# Patient Record
Sex: Female | Born: 1960 | Race: Black or African American | Hispanic: No | Marital: Married | Smoking: Former smoker
Health system: Southern US, Community
[De-identification: ages and names within clinical notes are randomized; demographics above are authoritative.]

## PROBLEM LIST (undated history)

## (undated) DIAGNOSIS — C50919 Malignant neoplasm of unspecified site of unspecified female breast: Secondary | ICD-10-CM

## (undated) DIAGNOSIS — Z5111 Encounter for antineoplastic chemotherapy: Secondary | ICD-10-CM

## (undated) DIAGNOSIS — E7800 Pure hypercholesterolemia, unspecified: Secondary | ICD-10-CM

## (undated) DIAGNOSIS — I10 Essential (primary) hypertension: Secondary | ICD-10-CM

---

## 2012-10-31 ENCOUNTER — Emergency Department (HOSPITAL_BASED_OUTPATIENT_CLINIC_OR_DEPARTMENT_OTHER)
Admission: EM | Admit: 2012-10-31 | Discharge: 2012-11-01 | Disposition: A | Discharge status: Home / Self Care | Payer: BC Managed Care – PPO | Attending: Emergency Medicine | Admitting: Emergency Medicine

## 2012-10-31 ENCOUNTER — Encounter (HOSPITAL_BASED_OUTPATIENT_CLINIC_OR_DEPARTMENT_OTHER): Payer: Self-pay

## 2012-10-31 DIAGNOSIS — N6310 Unspecified lump in the right breast, unspecified quadrant: Secondary | ICD-10-CM

## 2012-10-31 DIAGNOSIS — N63 Unspecified lump in unspecified breast: Secondary | ICD-10-CM | POA: Insufficient documentation

## 2012-10-31 DIAGNOSIS — R079 Chest pain, unspecified: Secondary | ICD-10-CM | POA: Insufficient documentation

## 2012-10-31 MED ORDER — CEPHALEXIN 500 MG PO CAPS: 500.0000 mg | ORAL_CAPSULE | Freq: Four times a day (QID) | ORAL | Status: DC

## 2012-10-31 NOTE — ED Provider Notes (Signed)
   History    CSN: [id number] Arrival date & time 10/31/12  2241  First MD Initiated Contact with Patient 10/31/12 2325     Chief Complaint  Patient presents with  . Breast Mass   (Consider location/radiation/quality/duration/timing/severity/associated sxs/prior Treatment) Patient is a 52 y.o. female presenting with chest pain. The history is provided by the patient. No language interpreter was used.  Chest Pain Pain location:  R chest Pain quality: aching   Pain radiates to:  Does not radiate Pain radiates to the back: no   Pain severity:  Moderate Onset quality:  Gradual Timing:  Constant Progression:  Worsening Chronicity:  New Relieved by:  Nothing Ineffective treatments:  None tried Pt complains of a knot in her right breast.   Pt reports area started draining this pm.   Pt reports  History reviewed. No pertinent past medical history. Past Surgical History  Procedure Laterality Date  . Cesarean section     No family history on file. History  Substance Use Topics  . Smoking status: Never Smoker   . Smokeless tobacco: Not on file  . Alcohol Use: No   OB History   Grav Para Term Preterm Abortions TAB SAB Ect Mult Living                 Review of Systems  Cardiovascular: Positive for chest pain.  All other systems reviewed and are negative.    Allergies  Review of patient's allergies indicates not on file.  Home Medications  No current outpatient prescriptions on file. BP 182/108  Pulse 84  Temp(Src) 98.7 F (37.1 C) (Oral)  Resp 20  Ht 5\' 4"  (1.626 m)  Wt 180 lb (81.647 kg)  BMI 30.88 kg/m2  SpO2 100% Physical Exam  Nursing note and vitals reviewed. Constitutional: She is oriented to person, place, and time. She appears well-developed and well-nourished.  HENT:  Head: Normocephalic and atraumatic.  Eyes: Pupils are equal, round, and reactive to light.  Cardiovascular: Normal rate.   Pulmonary/Chest: Effort normal.  Musculoskeletal: Normal range  of motion.  Neurological: She is alert and oriented to person, place, and time. She has normal reflexes.  Skin: Skin is warm.  Psychiatric: She has a normal mood and affect.    ED Course  Procedures (including critical care time) Labs Reviewed - No data to display No results found. 1. Breast mass, right     MDM  Pt advised to call [facility] to be seen for evaluation.   I advised pt I am concerned that this is a breast cancer,  I will treat with keflex to cover for infection due to drainage early today.  I explained to pt and husband my concern and the need for surgical evaluation.    Lonia Skinner [place], PA-C 11/01/12 0001

## 2012-10-31 NOTE — ED Notes (Signed)
C/o knot to right breast x 3-4 months-area started draining this pm

## 2012-11-01 NOTE — ED Provider Notes (Signed)
Medical screening examination/treatment/procedure(s) were performed by non-physician practitioner and as supervising physician I was immediately available for consultation/collaboration.   Dione Booze, MD 11/01/12 [phone]

## 2014-05-01 ENCOUNTER — Emergency Department (HOSPITAL_BASED_OUTPATIENT_CLINIC_OR_DEPARTMENT_OTHER)
Admission: EM | Admit: 2014-05-01 | Discharge: 2014-05-01 | Disposition: A | Discharge status: Home / Self Care | Payer: BC Managed Care – PPO | Attending: Emergency Medicine | Admitting: Emergency Medicine

## 2014-05-01 ENCOUNTER — Encounter (HOSPITAL_BASED_OUTPATIENT_CLINIC_OR_DEPARTMENT_OTHER): Payer: Self-pay

## 2014-05-01 DIAGNOSIS — Z853 Personal history of malignant neoplasm of breast: Secondary | ICD-10-CM | POA: Insufficient documentation

## 2014-05-01 DIAGNOSIS — H578 Other specified disorders of eye and adnexa: Secondary | ICD-10-CM | POA: Diagnosis present

## 2014-05-01 DIAGNOSIS — H109 Unspecified conjunctivitis: Secondary | ICD-10-CM | POA: Diagnosis not present

## 2014-05-01 DIAGNOSIS — Z792 Long term (current) use of antibiotics: Secondary | ICD-10-CM | POA: Diagnosis not present

## 2014-05-01 HISTORY — DX: Malignant neoplasm of unspecified site of unspecified female breast: C50.919

## 2014-05-01 HISTORY — DX: Encounter for antineoplastic chemotherapy: Z51.11

## 2014-05-01 MED ORDER — FLUORESCEIN SODIUM 1 MG OP STRP
ORAL_STRIP | OPHTHALMIC | Status: AC
  Filled 2014-05-01: qty 1

## 2014-05-01 MED ORDER — POLYMYXIN B-TRIMETHOPRIM 10000-0.1 UNIT/ML-% OP SOLN: 2.0000 [drp] | OPHTHALMIC | Status: DC

## 2014-05-01 MED ORDER — TETRACAINE HCL 0.5 % OP SOLN: 2.0000 [drp] | Freq: Once | OPHTHALMIC | Status: DC

## 2014-05-01 MED ORDER — FLUORESCEIN SODIUM 1 MG OP STRP: 1.0000 | ORAL_STRIP | Freq: Once | OPHTHALMIC | Status: DC

## 2014-05-01 MED ORDER — CROMOLYN SODIUM 4 % OP SOLN: 2.0000 [drp] | Freq: Two times a day (BID) | OPHTHALMIC | Status: AC

## 2014-05-01 MED ORDER — TETRACAINE HCL 0.5 % OP SOLN
OPHTHALMIC | Status: AC
  Filled 2014-05-01: qty 2

## 2014-05-01 NOTE — ED Notes (Signed)
Right eye red, draining and upper lid swelling- onset yesterday. Denies injury.

## 2014-05-01 NOTE — ED Notes (Signed)
MD at bedside. 

## 2014-05-01 NOTE — Discharge Instructions (Signed)
Conjunctivitis Conjunctivitis is commonly called "pink eye." Conjunctivitis can be caused by bacterial or viral infection, allergies, or injuries. There is usually redness of the lining of the eye, itching, discomfort, and sometimes discharge. There may be deposits of matter along the eyelids. A viral infection usually causes a watery discharge, while a bacterial infection causes a yellowish, thick discharge. Pink eye is very contagious and spreads by direct contact. You may be given antibiotic eyedrops as part of your treatment. Before using your eye medicine, remove all drainage from the eye by washing gently with warm water and cotton balls. Continue to use the medication until you have awakened 2 mornings in a row without discharge from the eye. Do not rub your eye. This increases the irritation and helps spread infection. Use separate towels from other household members. Wash your hands with soap and water before and after touching your eyes. Use cold compresses to reduce pain and sunglasses to relieve irritation from light. Do not wear contact lenses or wear eye makeup until the infection is gone. SEEK MEDICAL CARE IF:   Your symptoms are not better after 3 days of treatment.  You have increased pain or trouble seeing.  The outer eyelids become very red or swollen. Document Released: 05/28/2004 Document Revised: 07/13/2011 Document Reviewed: 04/20/2005 [facility] Patient Information 2015 [place], [place]. This information is not intended to replace advice given to you by your health care provider. Make sure you discuss any questions you have with your health care provider.  Eye Drops Use eye drops as directed. It may be easier to have someone help you put the drops in your eye. If you are alone, use the following instructions to help you.  Wash your hands before putting drops in your eyes.  Read the label and look at your medication. Check for any expiration date that may appear on the bottle or  tube. Changes of color may be a warning that the medication is old or ineffective. This is especially true if the medication has become brown in color. If you have questions or concerns, call your caregiver. DROPS  Tilt your head back with the affected eye uppermost. Gently pull down on your lower lid. Do not pull up on the upper lid.  Look up. Place the dropper or bottle just over the edge of the lower lid near the white portion at the bottom of the eye. The goal is to have the drop go into the little sac formed by the lower lid and the bottom of the eye itself. Do not release the drop from a height of several inches over the eye. That will only serve to startle the person receiving the medicine when it lands and forces a blink.  Steady your hand in a comfortable manner. An example would be to hold the dropper or bottle between your thumb and index (pointing) finger. Lean your index finger against the brow.  Then, slowly and gently squeeze one drop of medication into your eye.  Once the medication has been applied, place your finger between the lower eyelid and the nose, pressing firmly against the nose for 5-10 seconds. This will slow the process of the eye drop entering the small canal that normally drains tears into the nose, and therefore increases the exposure of the medicine to the eye for a few extra seconds. OINTMENTS  Look up. Place the tip of the tube just over the edge of the lower lid near the white portion at the bottom of the  eye. The goal is to create a line of ointment along the inner surface of the eyelid in the little sac formed by the lower lid and the bottom of the eye itself.  Avoid touching the tube tip to your eyeball or eyelid. This avoids contamination of the tube or the medicine in the tube.  Once a line of medicine has been created, hold the upper lid up and look down before releasing the upper lid. This will force the ointment to spread over the surface of the  eye.  Your vision will be very blurry for a few minutes after applying an ointment properly. This is normal and will clear as you continue to blink. For this reason, it is best to apply ointments just before going to sleep, or at a time when you can rest your eyes for 5-10 minutes after applying the medication. GENERAL  Store your medicine in a cool, dry place after each use.  If you need a second medication, wait at least two minutes. This helps the first medication to be taken up (absorbed) by the eye.  If you have been instructed to use both an eye drop and an eye ointment, always apply the drop first and then the ointment 3-4 minutes afterward. Never put medications into the eye unless the label reads, "For Ophthalmic Use," "For Use In Eyes" or "Eye Drops." If you have questions, call your caregiver. Document Released: 07/27/2000 Document Revised: 09/04/2013 Document Reviewed: 10/02/2008 [facility] Patient Information 2015 [place], [place]. This information is not intended to replace advice given to you by your health care provider. Make sure you discuss any questions you have with your health care provider.

## 2014-05-01 NOTE — ED Provider Notes (Signed)
CSN: [id number]     Arrival date & time 05/01/14  0813 History   First MD Initiated Contact with Patient 05/01/14 0818     Chief Complaint  Patient presents with  . Eye Problem     (Consider location/radiation/quality/duration/timing/severity/associated sxs/prior Treatment) Patient is a 53 y.o. female presenting with eye problem. The history is provided by the patient and the spouse. No language interpreter was used.  Eye Problem Location:  R eye Quality:  Tearing and foreign body sensation Severity:  Mild Onset quality:  Gradual Duration:  2 days Timing:  Constant Progression:  Worsening Chronicity:  New Context: not burn, not contact lens problem and not scratch   Context comment:  Wears fake eyelashes Relieved by:  Nothing Worsened by:  Nothing tried Ineffective treatments:  Commercial eye wash Associated symptoms: crusting, swelling and tearing   Associated symptoms: no decreased vision, no discharge, no double vision, no facial rash, no headaches, no inflammation, no nausea, no numbness, no photophobia, no scotomas, no vomiting and no weakness     Past Medical History  Diagnosis Date  . Breast cancer   . Maintenance chemotherapy following disease    Past Surgical History  Procedure Laterality Date  . Cesarean section     No family history on file. History  Substance Use Topics  . Smoking status: Never Smoker   . Smokeless tobacco: Not on file  . Alcohol Use: No   OB History    No data available     Review of Systems  Constitutional: Negative for fever, chills, diaphoresis, activity change, appetite change and fatigue.  HENT: Negative for congestion, facial swelling, rhinorrhea and sore throat.   Eyes: Negative for double vision, photophobia and discharge.  Respiratory: Negative for cough, chest tightness and shortness of breath.   Cardiovascular: Negative for chest pain, palpitations and leg swelling.  Gastrointestinal: Negative for nausea, vomiting,  abdominal pain and diarrhea.  Endocrine: Negative for polydipsia and polyuria.  Genitourinary: Negative for dysuria, frequency, difficulty urinating and pelvic pain.  Musculoskeletal: Negative for back pain, arthralgias, neck pain and neck stiffness.  Skin: Negative for color change and wound.  Allergic/Immunologic: Negative for immunocompromised state.  Neurological: Negative for facial asymmetry, weakness, numbness and headaches.  Hematological: Does not bruise/bleed easily.  Psychiatric/Behavioral: Negative for confusion and agitation.      Allergies  Review of patient's allergies indicates no known allergies.  Home Medications   Prior to Admission medications   Medication Sig Start Date End Date Taking? Authorizing Provider  cephALEXin (KEFLEX) 500 MG capsule Take 1 capsule (500 mg total) by mouth 4 (four) times daily. 10/31/12   Hollace Kinnier Sofia, PA-C   BP 134/76 mmHg  Pulse 72  Temp(Src) 98.4 F (36.9 C) (Oral)  Resp 16  SpO2 100% Physical Exam  Constitutional: She is oriented to person, place, and time. She appears well-developed and well-nourished. No distress.  HENT:  Head: Normocephalic and atraumatic.  Mouth/Throat: No oropharyngeal exudate.  Eyes: Pupils are equal, round, and reactive to light. Right eye exhibits discharge. Right eye exhibits no chemosis, no exudate and no hordeolum. No foreign body present in the right eye. Right conjunctiva is injected. Left conjunctiva is not injected. Left conjunctiva has no hemorrhage. No scleral icterus. Right eye exhibits normal extraocular motion and no nystagmus.  Fundoscopic exam:      The right eye shows no hemorrhage and no papilledema.  Slit lamp exam:      The right eye shows no corneal abrasion, no corneal  flare, no corneal ulcer and no fluorescein uptake.  Upper and lower R lids are edematous. Eyelash glue on upper lash line.   Neck: Normal range of motion. Neck supple.  Cardiovascular: Normal rate, regular rhythm and  normal heart sounds.  Exam reveals no gallop and no friction rub.   No murmur heard. Pulmonary/Chest: Effort normal and breath sounds normal. No respiratory distress. She has no wheezes. She has no rales.  Abdominal: Soft. Bowel sounds are normal. She exhibits no distension and no mass. There is no tenderness. There is no rebound and no guarding.  Musculoskeletal: Normal range of motion. She exhibits no edema or tenderness.  Neurological: She is alert and oriented to person, place, and time.  Skin: Skin is warm and dry.  Psychiatric: She has a normal mood and affect.    ED Course  Procedures (including critical care time) Labs Review Labs Reviewed - No data to display  Imaging Review No results found.   EKG Interpretation None      MDM   Final diagnoses:  Conjunctivitis of right eye    Pt is a 53 y.o. female with Pmhx as above who presents with conjunctival irritation, mild FB sensation and white drainage with matting from R eye since yesterday. She used eye wash w/o relief. She denies dec VA, photosensitivity, eye pain. No evidence of corneal abrasion with woods lamp exam, no inc IOP BL, slit lamp exam benign except for conjunctivitis, no cells or flare. She has on fake eyelashes for 2 weeks, has used in the past w/o difficulty. VA equal. Suspect a bacterial vs allergic conjunctivitis. Spoke w/ Dr. Frederico Hamman of othalmology given she has no f/u and it is the Holiday season w/ lmited office hours. He rec cromolyn gtt and can see her at 10am tomorrow.     Ernestina Patches, MD 05/01/14 [phone]

## 2018-12-14 ENCOUNTER — Other Ambulatory Visit: Payer: Self-pay

## 2018-12-14 ENCOUNTER — Encounter (HOSPITAL_BASED_OUTPATIENT_CLINIC_OR_DEPARTMENT_OTHER): Payer: Self-pay | Admitting: Emergency Medicine

## 2018-12-14 ENCOUNTER — Emergency Department (HOSPITAL_BASED_OUTPATIENT_CLINIC_OR_DEPARTMENT_OTHER)
Admission: EM | Admit: 2018-12-14 | Discharge: 2018-12-14 | Disposition: A | Discharge status: Home / Self Care | Payer: BC Managed Care – PPO | Attending: Emergency Medicine | Admitting: Emergency Medicine

## 2018-12-14 DIAGNOSIS — N390 Urinary tract infection, site not specified: Secondary | ICD-10-CM | POA: Diagnosis not present

## 2018-12-14 DIAGNOSIS — Z853 Personal history of malignant neoplasm of breast: Secondary | ICD-10-CM | POA: Diagnosis not present

## 2018-12-14 DIAGNOSIS — R300 Dysuria: Secondary | ICD-10-CM | POA: Diagnosis present

## 2018-12-14 DIAGNOSIS — Z79899 Other long term (current) drug therapy: Secondary | ICD-10-CM | POA: Insufficient documentation

## 2018-12-14 HISTORY — DX: Pure hypercholesterolemia, unspecified: E78.00

## 2018-12-14 HISTORY — DX: Essential (primary) hypertension: I10

## 2018-12-14 LAB — URINALYSIS, ROUTINE W REFLEX MICROSCOPIC
Bilirubin Urine: NEGATIVE
Glucose, UA: NEGATIVE mg/dL
Ketones, ur: NEGATIVE mg/dL
Nitrite: NEGATIVE
Protein, ur: 30 mg/dL — AB
Specific Gravity, Urine: 1.025 (ref 1.005–1.030)
pH: 6 (ref 5.0–8.0)

## 2018-12-14 LAB — URINALYSIS, MICROSCOPIC (REFLEX): WBC, UA: >50 WBC/hpf (ref 0–5)

## 2018-12-14 MED ORDER — CEPHALEXIN 500 MG PO CAPS: 500.0000 mg | ORAL_CAPSULE | Freq: Two times a day (BID) | ORAL | 0 refills | Status: AC

## 2018-12-14 MED FILL — CEPHALEXIN 500 MG CAPSULE: 500 | 5 days supply | Qty: 10 | Fill #0

## 2018-12-14 NOTE — ED Triage Notes (Signed)
Dysuria and lower back pain x 2 days, denies n/v or fever

## 2018-12-14 NOTE — ED Provider Notes (Addendum)
[facility] EMERGENCY DEPARTMENT Provider Note   CSN: [id number] Arrival date & time: 12/14/18  0800    History   Chief Complaint Chief Complaint  Patient presents with  . Dysuria    HPI Jo Miller is a 58 y.o. female.     The history is provided by the patient.  Dysuria Pain quality:  Burning Pain severity:  Mild Onset quality:  Gradual Duration:  2 days Timing:  Intermittent Progression:  Waxing and waning Chronicity:  New Recent urinary tract infections: no   Relieved by:  Nothing Ineffective treatments:  None tried Urinary symptoms: frequent urination   Associated symptoms: no abdominal pain, no fever, no flank pain, no genital lesions, no nausea, no vaginal discharge and no vomiting   Risk factors: no hx of pyelonephritis, no recurrent urinary tract infections and no sexually transmitted infections     Past Medical History:  Diagnosis Date  . Breast cancer ([place])   . High cholesterol   . Hypertension   . Maintenance chemotherapy following disease     There are no active problems to display for this patient.   Past Surgical History:  Procedure Laterality Date  . CESAREAN SECTION       OB History   No obstetric history on file.      Home Medications    Prior to Admission medications   Medication Sig Start Date End Date Taking? Authorizing Provider  amLODipine (NORVASC) 5 MG tablet Take by mouth. 03/02/16  Yes [provider]  atorvastatin (LIPITOR) 40 MG tablet atorvastatin 40 mg tablet    [provider]  cephALEXin (KEFLEX) 500 MG capsule Take 1 capsule (500 mg total) by mouth 2 (two) times daily for 5 days. 12/14/18 12/19/18  Tauheed Mcfayden, DO  cromolyn (OPTICROM) 4 % ophthalmic solution Place 2 drops into the right eye 2 (two) times daily. 05/01/14   Ernestina Patches, MD    Family History No family history on file.  Social History Social History   Tobacco Use  . Smoking status: Never Smoker  . Smokeless  tobacco: Never Used  Substance Use Topics  . Alcohol use: No  . Drug use: No     Allergies   Patient has no known allergies.   Review of Systems Review of Systems  Constitutional: Negative for chills and fever.  HENT: Negative for ear pain and sore throat.   Eyes: Negative for pain and visual disturbance.  Respiratory: Negative for cough and shortness of breath.   Cardiovascular: Negative for chest pain and palpitations.  Gastrointestinal: Negative for abdominal distention, abdominal pain, anal bleeding, nausea and vomiting.  Genitourinary: Negative for dysuria, flank pain, hematuria and vaginal discharge.  Musculoskeletal: Positive for back pain (not currently, feels like spasms in her low back when she urinates ). Negative for arthralgias.  Skin: Negative for color change and rash.  Neurological: Negative for seizures and syncope.  All other systems reviewed and are negative.    Physical Exam Updated Vital Signs  ED Triage Vitals  Enc Vitals Group     BP 12/14/18 0811 (!) 168/97     Pulse Rate 12/14/18 0811 65     Resp 12/14/18 0811 14     Temp 12/14/18 0811 97.9 F (36.6 C)     Temp Source 12/14/18 0811 Oral     SpO2 12/14/18 0811 100 %     Weight 12/14/18 0815 206 lb 1.6 oz (93.5 kg)     Height 12/14/18 0815 5\' 3"  (1.6 m)  Head Circumference --      Peak Flow --      Pain Score 12/14/18 0817 1     Pain Loc --      Pain Edu? --      Excl. in [place]? --      Physical Exam Vitals signs and nursing note reviewed.  Constitutional:      General: She is not in acute distress.    Appearance: She is well-developed.  HENT:     Head: Normocephalic and atraumatic.  Eyes:     Extraocular Movements: Extraocular movements intact.     Conjunctiva/sclera: Conjunctivae normal.     Pupils: Pupils are equal, round, and reactive to light.  Pulmonary:     Effort: Pulmonary effort is normal.  Abdominal:     General: There is no distension.     Palpations: Abdomen is soft.      Tenderness: There is no abdominal tenderness.  Musculoskeletal: Normal range of motion.        General: No tenderness.     Comments: No midline low back tenderness  Skin:    General: Skin is warm and dry.  Neurological:     Mental Status: She is alert.      ED Treatments / Results  Labs (all labs ordered are listed, but only abnormal results are displayed) Labs Reviewed  URINALYSIS, ROUTINE W REFLEX MICROSCOPIC - Abnormal; Notable for the following components:      Result Value   APPearance CLOUDY (*)    Hgb urine dipstick LARGE (*)    Protein, ur 30 (*)    Leukocytes,Ua LARGE (*)    All other components within normal limits  URINALYSIS, MICROSCOPIC (REFLEX) - Abnormal; Notable for the following components:   Bacteria, UA FEW (*)    All other components within normal limits  URINE CULTURE    EKG None  Radiology No results found.  Procedures Procedures (including critical care time)  Medications Ordered in ED Medications - No data to display   Initial Impression / Assessment and Plan / ED Course  I have reviewed the triage vital signs and the nursing notes.  Pertinent labs & imaging results that were available during my care of the patient were reviewed by me and considered in my medical decision making (see chart for details).     Jo Miller is a 58 year old female history of breast cancer in remission who presents to the ED with dysuria.  Patient with normal vitals.  No fever.  Symptoms ongoing for the last 2 days.  Has had some bladder spasm/low back pain when she urinates.  Overall she is comfortable now.  No abdominal tenderness on exam.  No back tenderness on exam.  Denies any trauma.  Doubt pyelonephritis.  She has not had any nausea or vomiting.  No concerns for other intra-abdominal process such as appendicitis or bowel obstruction.  Symptoms appear consistent with a UTI/possibly some mild dehydration.  Will evaluate with urinalysis.  Patient denies  any vaginal discharge, vaginal bleeding.  Pt w/ no concern for STDs.  UA positive for infection.  Will treat with Keflex.  Urine culture has been sent.  Patient discharged in ED in good condition.  This chart was dictated using voice recognition software.  Despite best efforts to proofread,  errors can occur which can change the documentation meaning.    Final Clinical Impressions(s) / ED Diagnoses   Final diagnoses:  Lower urinary tract infectious disease    ED Discharge Orders  Ordered    cephALEXin (KEFLEX) 500 MG capsule  2 times daily     12/14/18 0849           Lennice Sites, DO 12/14/18 0850    Lennice Sites, DO 12/14/18 [phone]

## 2018-12-14 NOTE — ED Notes (Signed)
ED Provider at bedside. 

## 2018-12-15 LAB — URINE CULTURE: Culture: <10000 — AB

## 2019-07-11 ENCOUNTER — Emergency Department (HOSPITAL_BASED_OUTPATIENT_CLINIC_OR_DEPARTMENT_OTHER)
Admission: EM | Admit: 2019-07-11 | Discharge: 2019-07-11 | Disposition: A | Discharge status: Home / Self Care | Payer: BC Managed Care – PPO | Attending: Emergency Medicine | Admitting: Emergency Medicine

## 2019-07-11 ENCOUNTER — Other Ambulatory Visit: Payer: Self-pay

## 2019-07-11 ENCOUNTER — Encounter (HOSPITAL_BASED_OUTPATIENT_CLINIC_OR_DEPARTMENT_OTHER): Payer: Self-pay | Admitting: *Deleted

## 2019-07-11 DIAGNOSIS — Z79899 Other long term (current) drug therapy: Secondary | ICD-10-CM | POA: Diagnosis not present

## 2019-07-11 DIAGNOSIS — I10 Essential (primary) hypertension: Secondary | ICD-10-CM | POA: Diagnosis not present

## 2019-07-11 DIAGNOSIS — M542 Cervicalgia: Secondary | ICD-10-CM | POA: Diagnosis not present

## 2019-07-11 DIAGNOSIS — S3992XA Unspecified injury of lower back, initial encounter: Secondary | ICD-10-CM | POA: Diagnosis present

## 2019-07-11 DIAGNOSIS — S39012A Strain of muscle, fascia and tendon of lower back, initial encounter: Secondary | ICD-10-CM | POA: Diagnosis not present

## 2019-07-11 DIAGNOSIS — Y9289 Other specified places as the place of occurrence of the external cause: Secondary | ICD-10-CM | POA: Insufficient documentation

## 2019-07-11 DIAGNOSIS — Y9389 Activity, other specified: Secondary | ICD-10-CM | POA: Insufficient documentation

## 2019-07-11 DIAGNOSIS — Y999 Unspecified external cause status: Secondary | ICD-10-CM | POA: Diagnosis not present

## 2019-07-11 MED ORDER — LIDOCAINE 5 % EX PTCH: 1.0000 | MEDICATED_PATCH | CUTANEOUS | 0 refills | Status: AC

## 2019-07-11 MED ORDER — METHOCARBAMOL 500 MG PO TABS: 500.0000 mg | ORAL_TABLET | Freq: Two times a day (BID) | ORAL | 0 refills | Status: AC

## 2019-07-11 NOTE — ED Notes (Signed)
Pt c/o neck soreness and lower left back pain that radiates towards left side.

## 2019-07-11 NOTE — Discharge Instructions (Addendum)
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
## 2021-04-03 ENCOUNTER — Emergency Department (HOSPITAL_BASED_OUTPATIENT_CLINIC_OR_DEPARTMENT_OTHER): Payer: BC Managed Care – PPO

## 2021-04-03 ENCOUNTER — Other Ambulatory Visit: Payer: Self-pay

## 2021-04-03 ENCOUNTER — Emergency Department (HOSPITAL_BASED_OUTPATIENT_CLINIC_OR_DEPARTMENT_OTHER)
Admission: EM | Admit: 2021-04-03 | Discharge: 2021-04-03 | Disposition: A | Discharge status: Home / Self Care | Payer: BC Managed Care – PPO | Attending: Emergency Medicine | Admitting: Emergency Medicine

## 2021-04-03 ENCOUNTER — Encounter (HOSPITAL_BASED_OUTPATIENT_CLINIC_OR_DEPARTMENT_OTHER): Payer: Self-pay

## 2021-04-03 DIAGNOSIS — W19XXXA Unspecified fall, initial encounter: Secondary | ICD-10-CM

## 2021-04-03 DIAGNOSIS — I10 Essential (primary) hypertension: Secondary | ICD-10-CM | POA: Insufficient documentation

## 2021-04-03 DIAGNOSIS — R0789 Other chest pain: Secondary | ICD-10-CM

## 2021-04-03 DIAGNOSIS — S299XXA Unspecified injury of thorax, initial encounter: Secondary | ICD-10-CM | POA: Insufficient documentation

## 2021-04-03 DIAGNOSIS — W108XXA Fall (on) (from) other stairs and steps, initial encounter: Secondary | ICD-10-CM | POA: Diagnosis not present

## 2021-04-03 DIAGNOSIS — Z87891 Personal history of nicotine dependence: Secondary | ICD-10-CM | POA: Diagnosis not present

## 2021-04-03 DIAGNOSIS — Z853 Personal history of malignant neoplasm of breast: Secondary | ICD-10-CM | POA: Insufficient documentation

## 2021-04-03 DIAGNOSIS — M25511 Pain in right shoulder: Secondary | ICD-10-CM

## 2021-04-03 DIAGNOSIS — Z79899 Other long term (current) drug therapy: Secondary | ICD-10-CM | POA: Insufficient documentation

## 2021-04-03 DIAGNOSIS — S4991XA Unspecified injury of right shoulder and upper arm, initial encounter: Secondary | ICD-10-CM | POA: Insufficient documentation

## 2021-04-03 MED ORDER — AMLODIPINE BESYLATE 5 MG PO TABS: 5.0000 mg | ORAL_TABLET | Freq: Every day | ORAL | 0 refills | Status: AC

## 2021-04-03 MED ORDER — HYDROCODONE-ACETAMINOPHEN 5-325 MG PO TABS
1.0000 | ORAL_TABLET | Freq: Once | ORAL | Status: AC
  Administered 2021-04-03: 1 via ORAL
  Filled 2021-04-03: qty 1

## 2021-04-03 MED ORDER — HYDROCODONE-ACETAMINOPHEN 5-325 MG PO TABS: 1.0000 | ORAL_TABLET | Freq: Four times a day (QID) | ORAL | 0 refills | Status: DC | PRN

## 2021-04-03 MED ORDER — LIDOCAINE 5 % EX PTCH
1.0000 | MEDICATED_PATCH | Freq: Once | CUTANEOUS | Status: DC
  Administered 2021-04-03: 1 via TRANSDERMAL
  Filled 2021-04-03: qty 1

## 2021-04-03 NOTE — ED Triage Notes (Signed)
Pt states she fell into patio chair 2 days ago-pain to right UE and across chest-NAD-steady gait

## 2021-04-03 NOTE — ED Notes (Signed)
Pt is in xray at this time 

## 2021-04-03 NOTE — ED Provider Notes (Signed)
[facility] EMERGENCY DEPARTMENT Provider Note   CSN: [id number] Arrival date & time: 04/03/21  1836     History Chief Complaint  Patient presents with   Lytle Michaels    Jo Miller is a 60 y.o. female.  Jo Miller is a 60 y.o. female with a history of hypertension, hyperlipidemia, breast cancer, who presents to the emergency department for evaluation after a fall.  Patient reports 2 days ago she tripped while going up the steps on her porch and ran into a patio chair.  She reports that she hit this with her right upper chest and shoulder and then fell back onto the back of her right shoulder.  She did not hit her head or have any loss of consciousness.  Reports pain is persistent in the right upper chest and under the right arm and worse with movement of the shoulder.  She denies any associated shortness of breath.  No abdominal pain.  No neck or back pain.  No pain in her lower extremities and she has been walking without difficulty.  No other aggravating or alleviating factors.  Patient noted to be hypertensive on arrival, reports that she was previously on blood pressure medication when she was on cancer treatment about 2 to 3 years ago but has not followed up with a doctor since then and has not been on any blood pressure medication.  She denies any associated headache, visual changes, dizziness, chest pain, shortness of breath or lower extremity swelling.  The history is provided by the patient and medical records.      Past Medical History:  Diagnosis Date   Breast cancer ([place])    High cholesterol    Hypertension    Maintenance chemotherapy following disease     There are no problems to display for this patient.   Past Surgical History:  Procedure Laterality Date   CESAREAN SECTION       OB History   No obstetric history on file.     No family history on file.  Social History   Tobacco Use   Smoking status: Former    Types: Cigarettes   Smokeless  tobacco: Never  Vaping Use   Vaping Use: Never used  Substance Use Topics   Alcohol use: No   Drug use: No    Home Medications Prior to Admission medications   Medication Sig Start Date End Date Taking? Authorizing Provider  HYDROcodone-acetaminophen (NORCO) 5-325 MG tablet Take 1 tablet by mouth every 6 (six) hours as needed. 04/03/21  Yes Jacqlyn Larsen, PA-C  amLODipine (NORVASC) 5 MG tablet Take 1 tablet (5 mg total) by mouth daily. 04/03/21   Jacqlyn Larsen, PA-C  atorvastatin (LIPITOR) 40 MG tablet atorvastatin 40 mg tablet    [provider]  cromolyn (OPTICROM) 4 % ophthalmic solution Place 2 drops into the right eye 2 (two) times daily. 05/01/14   Ernestina Patches, MD  lidocaine (LIDODERM) 5 % Place 1 patch onto the skin daily. Remove & Discard patch within 12 hours or as directed by MD 07/11/19   Joy, Shawn C, PA-C  methocarbamol (ROBAXIN) 500 MG tablet Take 1 tablet (500 mg total) by mouth 2 (two) times daily. 07/11/19   Joy, Helane Gunther, PA-C    Allergies    Patient has no known allergies.  Review of Systems   Review of Systems  Constitutional:  Negative for chills and fever.  HENT: Negative.    Eyes:  Negative for visual disturbance.  Respiratory:  Negative for shortness of breath.   Cardiovascular:  Positive for chest pain.  Gastrointestinal:  Negative for abdominal pain, nausea and vomiting.  Musculoskeletal:  Positive for arthralgias. Negative for back pain and neck pain.  Skin:  Negative for color change and wound.  Neurological:  Negative for dizziness, weakness, light-headedness, numbness and headaches.  All other systems reviewed and are negative.  Physical Exam Updated Vital Signs BP (!) 177/112   Pulse 62   Temp 98 F (36.7 C) (Oral)   Resp 16   Ht 5\' 4"  (1.626 m)   Wt 82.1 kg   SpO2 97%   BMI 31.07 kg/m   Physical Exam Vitals and nursing note reviewed.  Constitutional:      General: She is not in acute distress.    Appearance: Normal  appearance. She is well-developed and normal weight. She is not ill-appearing or diaphoretic.  HENT:     Head: Normocephalic and atraumatic.     Comments: No evidence of head trauma Eyes:     General:        Right eye: No discharge.        Left eye: No discharge.  Cardiovascular:     Rate and Rhythm: Normal rate and regular rhythm.     Pulses: Normal pulses.     Heart sounds: Normal heart sounds.  Pulmonary:     Effort: Pulmonary effort is normal. No respiratory distress.     Breath sounds: Normal breath sounds. No wheezing or rales.     Comments: Respirations equal and unlabored, patient able to speak in full sentences, lungs clear to auscultation bilaterally, tenderness over the right upper chest wall and right lateral ribs, no ecchymosis, no deformity or crepitus Chest:     Chest wall: Tenderness present.  Abdominal:     General: Bowel sounds are normal. There is no distension.     Palpations: Abdomen is soft. There is no mass.     Tenderness: There is no abdominal tenderness. There is no guarding.     Comments: Abdomen soft, nondistended, nontender to palpation in all quadrants without guarding or peritoneal signs  Musculoskeletal:        General: Tenderness present. No deformity.     Cervical back: Neck supple.     Comments: Tenderness over the right shoulder without deformity, ROM intact with mild pain, all other joints supple and easily moveable, no midline spinal tenderness  Skin:    General: Skin is warm and dry.     Capillary Refill: Capillary refill takes less than 2 seconds.  Neurological:     Mental Status: She is alert and oriented to person, place, and time.     Coordination: Coordination normal.     Comments: Speech is clear, able to follow commands Moves extremities without ataxia, coordination intact  Psychiatric:        Mood and Affect: Mood normal.        Behavior: Behavior normal.    ED Results / Procedures / Treatments   Labs (all labs ordered are  listed, but only abnormal results are displayed) Labs Reviewed - No data to display  EKG None  Radiology DG Ribs Unilateral W/Chest Right  Result Date: 04/03/2021 CLINICAL DATA:  Status post fall. EXAM: RIGHT RIBS AND CHEST - 3+ VIEW COMPARISON:  None. FINDINGS: No fracture or other bone lesions are seen involving the ribs. A left-sided venous Port-A-Cath is in place, with its distal tip seen overlying the midportion of the superior vena cava. There  is no evidence of pneumothorax or pleural effusion. Very mild atelectasis is seen within the bilateral lung bases. Heart size and mediastinal contours are within normal limits. IMPRESSION: No acute rib fracture or other bone lesions are seen. Electronically Signed   By: Virgina Norfolk M.D.   On: 04/03/2021 19:43   DG Shoulder Right  Result Date: 04/03/2021 CLINICAL DATA:  Status post fall. EXAM: RIGHT SHOULDER - 2+ VIEW COMPARISON:  None. FINDINGS: There is no evidence of an acute fracture or dislocation. There is no evidence of arthropathy or other focal bone abnormality. A left-sided venous catheter is seen. Soft tissues are unremarkable. IMPRESSION: Negative. Electronically Signed   By: Virgina Norfolk M.D.   On: 04/03/2021 19:42    Procedures Procedures   Medications Ordered in ED Medications  lidocaine (LIDODERM) 5 % 1 patch (1 patch Transdermal Patch Applied 04/03/21 2019)  HYDROcodone-acetaminophen (NORCO/VICODIN) 5-325 MG per tablet 1 tablet (1 tablet Oral Given 04/03/21 1939)    ED Course  I have reviewed the triage vital signs and the nursing notes.  Pertinent labs & imaging results that were available during my care of the patient were reviewed by me and considered in my medical decision making (see chart for details).    MDM Rules/Calculators/A&P                           60 year old female presents after a fall 2 days ago.  She tripped going up to her porch and hit her right shoulder and right upper chest on a patio  chair.  Pain has continued over the past 2 days.  Pain is worse with movement.  No significant deformity on exam in the right upper extremity is neurovascularly intact.  Breath sounds are present and equal bilaterally.  Concern for soft tissue injury versus rib fracture.  We will get x-rays of the right shoulder and right chest and ribs.  Pain treated here in the ED.  X-rays reviewed by myself, agree with radiologist findings, no acute bony abnormalities to the right shoulder, no dislocation, no acute rib fracture or other abnormalities noted on the chest and rib films.  We will treat for chest wall pain versus occult rib fracture, patient provided with incentive spirometer and will prescribe short course of hydrocodone, will also encourage patient to use topical lidocaine patches, ice and heat.  PCP follow-up encouraged.  Pt's blood pressure was elevated today, pt has hx of HTN, has not been on blood pressure medications in 2 to 3 years, pt is not exhibiting any symptoms to suggest hypertensive urgency or emergency today, will have pt follow up with their PCP in 1 week for blood pressure check. Discussed long term consequences of untreated hypertension with the patient.   Final Clinical Impression(s) / ED Diagnoses Final diagnoses:  Fall, initial encounter  Chest wall pain  Acute pain of right shoulder    Rx / DC Orders ED Discharge Orders          Ordered    amLODipine (NORVASC) 5 MG tablet  Daily        04/03/21 2027    HYDROcodone-acetaminophen (NORCO) 5-325 MG tablet  Every 6 hours PRN        04/03/21 2027             Jacqlyn Larsen, PA-C 04/03/21 2035    Fredia Sorrow, MD 04/05/21 [phone]

## 2021-04-03 NOTE — Discharge Instructions (Addendum)
Your x-rays today look good.  You could still have a small rib fracture that is not seen on chest x-ray.  This is treated with pain management and using incentive spirometer to make sure you are taking good deep breaths.  Use this several times throughout the day.  Use the pain medication as prescribed, take this medication with caution as it can cause drowsiness, do not drive while on this medication.  Your blood pressure was elevated today, please follow up with your primary doctor in 1 week for blood pressure recheck.

## 2022-12-12 ENCOUNTER — Encounter (HOSPITAL_BASED_OUTPATIENT_CLINIC_OR_DEPARTMENT_OTHER): Payer: Self-pay | Admitting: Emergency Medicine

## 2022-12-12 ENCOUNTER — Other Ambulatory Visit: Payer: Self-pay

## 2022-12-12 ENCOUNTER — Emergency Department (HOSPITAL_BASED_OUTPATIENT_CLINIC_OR_DEPARTMENT_OTHER)
Admission: EM | Admit: 2022-12-12 | Discharge: 2022-12-12 | Disposition: A | Discharge status: Home / Self Care | Payer: BC Managed Care – PPO | Attending: Emergency Medicine | Admitting: Emergency Medicine

## 2022-12-12 DIAGNOSIS — R000 Tachycardia, unspecified: Secondary | ICD-10-CM | POA: Insufficient documentation

## 2022-12-12 DIAGNOSIS — N3000 Acute cystitis without hematuria: Secondary | ICD-10-CM | POA: Diagnosis not present

## 2022-12-12 DIAGNOSIS — Z79899 Other long term (current) drug therapy: Secondary | ICD-10-CM | POA: Diagnosis not present

## 2022-12-12 DIAGNOSIS — U071 COVID-19: Secondary | ICD-10-CM | POA: Diagnosis not present

## 2022-12-12 DIAGNOSIS — Z853 Personal history of malignant neoplasm of breast: Secondary | ICD-10-CM | POA: Diagnosis not present

## 2022-12-12 DIAGNOSIS — I10 Essential (primary) hypertension: Secondary | ICD-10-CM | POA: Diagnosis not present

## 2022-12-12 DIAGNOSIS — R059 Cough, unspecified: Secondary | ICD-10-CM | POA: Diagnosis present

## 2022-12-12 LAB — URINALYSIS, ROUTINE W REFLEX MICROSCOPIC
Glucose, UA: 100 mg/dL — AB
Ketones, ur: NEGATIVE mg/dL
Nitrite: POSITIVE — AB
Protein, ur: 100 mg/dL — AB
Specific Gravity, Urine: 1.025 (ref 1.005–1.030)
pH: 5.5 (ref 5.0–8.0)

## 2022-12-12 LAB — URINALYSIS, MICROSCOPIC (REFLEX): WBC, UA: >50 WBC/hpf (ref 0–5)

## 2022-12-12 LAB — RESP PANEL BY RT-PCR (RSV, FLU A&B, COVID)  RVPGX2
Influenza A by PCR: NEGATIVE
Influenza B by PCR: NEGATIVE
Resp Syncytial Virus by PCR: NEGATIVE
SARS Coronavirus 2 by RT PCR: POSITIVE — AB

## 2022-12-12 MED ORDER — CEPHALEXIN 500 MG PO CAPS: 500.0000 mg | ORAL_CAPSULE | Freq: Two times a day (BID) | ORAL | 0 refills | Status: AC

## 2022-12-12 NOTE — ED Triage Notes (Signed)
Dysuria x 4 days , take AZO with no relief , no abd pain yet lower back pain . Denies hematuria .   Also reports congestion x 2 days and headache , cough up yellow sputum  . Scratchy throat yet no sore throat .

## 2022-12-12 NOTE — ED Provider Notes (Signed)
[facility] DEPARTMENT AT [facility] Provider Note   CSN: [id number] Arrival date & time: 12/12/22  1425     History Chief Complaint  Patient presents with   Dysuria   URI    Jo Miller is a 62 y.o. female.  Patient with past history significant for breast cancer in remission, hypertension, high cholesterol presents to the emergency department concerns of dysuria.  Reports that the dysuria is been ongoing for the last 4 days and denies any abdominal pain, low back pain, hematuria, or fevers.  Patient is also reporting 2 days of headache, congestion, cough, and a scratchy throat.  No obvious exposures to anyone else with similar symptoms.  Has tried taking over-the-counter medications for pain as well as Azo for urinary symptoms without improvement.   Dysuria URI Presenting symptoms: cough        Home Medications Prior to Admission medications   Medication Sig Start Date End Date Taking? Authorizing Provider  cephALEXin (KEFLEX) 500 MG capsule Take 1 capsule (500 mg total) by mouth 2 (two) times daily for 7 days. 12/12/22 12/19/22 Yes Smitty Knudsen, PA-C  amLODipine (NORVASC) 5 MG tablet Take 1 tablet (5 mg total) by mouth daily. 04/03/21   Dartha Lodge, PA-C  atorvastatin (LIPITOR) 40 MG tablet atorvastatin 40 mg tablet    [provider]  cromolyn (OPTICROM) 4 % ophthalmic solution Place 2 drops into the right eye 2 (two) times daily. 05/01/14   Toy Cookey, MD  HYDROcodone-acetaminophen (NORCO) 5-325 MG tablet Take 1 tablet by mouth every 6 (six) hours as needed. 04/03/21   Dartha Lodge, PA-C  lidocaine (LIDODERM) 5 % Place 1 patch onto the skin daily. Remove & Discard patch within 12 hours or as directed by MD 07/11/19   Joy, Shawn C, PA-C  methocarbamol (ROBAXIN) 500 MG tablet Take 1 tablet (500 mg total) by mouth 2 (two) times daily. 07/11/19   Joy, Hillard Danker, PA-C      Allergies    Patient has no known allergies.    Review of Systems    Review of Systems  Respiratory:  Positive for cough.   Genitourinary:  Positive for dysuria.  All other systems reviewed and are negative.   Physical Exam Updated Vital Signs BP (!) 163/111 (BP Location: Left Arm)   Pulse (!) 106   Temp 98.1 F (36.7 C) (Oral)   Resp 16   Wt 85.4 kg   SpO2 97%   BMI 32.32 kg/m  Physical Exam Vitals and nursing note reviewed.  Constitutional:      General: She is not in acute distress.    Appearance: Normal appearance. She is well-developed. She is not ill-appearing.  HENT:     Head: Normocephalic and atraumatic.  Eyes:     Conjunctiva/sclera: Conjunctivae normal.  Cardiovascular:     Rate and Rhythm: Regular rhythm. Tachycardia present.     Heart sounds: No murmur heard. Pulmonary:     Effort: Pulmonary effort is normal. No respiratory distress.     Breath sounds: Normal breath sounds. No wheezing or rales.  Abdominal:     Palpations: Abdomen is soft.     Tenderness: There is no abdominal tenderness.  Musculoskeletal:        General: No swelling.     Cervical back: Neck supple.  Skin:    General: Skin is warm and dry.     Capillary Refill: Capillary refill takes less than 2 seconds.  Neurological:  Mental Status: She is alert.  Psychiatric:        Mood and Affect: Mood normal.     ED Results / Procedures / Treatments   Labs (all labs ordered are listed, but only abnormal results are displayed) Labs Reviewed  RESP PANEL BY RT-PCR (RSV, FLU A&B, COVID)  RVPGX2 - Abnormal; Notable for the following components:      Result Value   SARS Coronavirus 2 by RT PCR POSITIVE (*)    All other components within normal limits  URINALYSIS, ROUTINE W REFLEX MICROSCOPIC - Abnormal; Notable for the following components:   Color, Urine AMBER (*)    APPearance HAZY (*)    Glucose, UA 100 (*)    Hgb urine dipstick TRACE (*)    Bilirubin Urine SMALL (*)    Protein, ur 100 (*)    Nitrite POSITIVE (*)    Leukocytes,Ua SMALL (*)    All  other components within normal limits  URINALYSIS, MICROSCOPIC (REFLEX) - Abnormal; Notable for the following components:   Bacteria, UA MANY (*)    All other components within normal limits    EKG None  Radiology No results found.  Procedures Procedures   Medications Ordered in ED Medications - No data to display  ED Course/ Medical Decision Making/ A&P                               Medical Decision Making Amount and/or Complexity of Data Reviewed Labs: ordered.   This patient presents to the ED for concern of dysuria, URI.  Differential diagnosis includes UTI, pyelonephritis, nephrolithiasis, COVID-19, influenza   Lab Tests:  I Ordered, and personally interpreted labs.  The pertinent results include: UA with evidence of infection, COVID-19 positive   Problem List / ED Course:  Patient presented to the emergency department complaints of dysuria and cough.  No obvious sick exposures.  States that the dysuria has been present for the last 4 days or so and has tried taking Azo without improvement in symptoms.  With frequent history of UTIs per patient's reported history.  Has not currently been on any antibiotics but currently bout of symptoms.  Will evaluate with UA and respiratory viral panel. Patient does have evidence of UTI on UA.  Will require treatment with antibiotics for improvement.  Patient is also positive for COVID-19 at this time which is likely explaining her symptoms of cough, congestion, and subjective fevers.  Given low risk factors and discussion with patient, patient prefers to manage symptoms with over-the-counter medications.  Advised patient to return the emergency department if she has any acute worsening of symptoms or declining such as severe shortness of breath, severe fever, chest pain, or intractable vomiting.  Low concern at this time for any worsening or more concerning symptoms that would require admission.  Patient is agreeable with plan to  discharge home and verbalized understanding all return precautions.  All questions answered prior to patient discharge.  Final Clinical Impression(s) / ED Diagnoses Final diagnoses:  Acute cystitis without hematuria  COVID-19    Rx / DC Orders ED Discharge Orders          Ordered    cephALEXin (KEFLEX) 500 MG capsule  2 times daily        12/12/22 1720              Smitty Knudsen, PA-C 12/12/22 1725    Charlynne Pander, MD 12/13/22  1456  

## 2022-12-12 NOTE — Discharge Instructions (Addendum)
You are seen in the emergency department for concerns of urinary pain as well as respiratory viral symptoms.  Your urinalysis does show evidence of infection and AN antibiotic has been sent to your pharmacy which you should take twice daily for the next 7 days.  Your respiratory viral panel is also positive for COVID-19.  Manage her symptoms with over-the-counter medications at home as best you can.  Try to limit your exposure to other individuals who may be vulnerable.  If symptoms are acutely worsening, please return to the emergency department.  Otherwise follow-up with your primary care provider.

## 2023-03-03 IMAGING — DX DG SHOULDER 2+V*R*
3 series · 3 of 3 positions shown · non-contrast
Comparison: None.

CLINICAL DATA: Status post fall.

EXAM:
RIGHT SHOULDER - 2+ VIEW

[shoulder grashey]
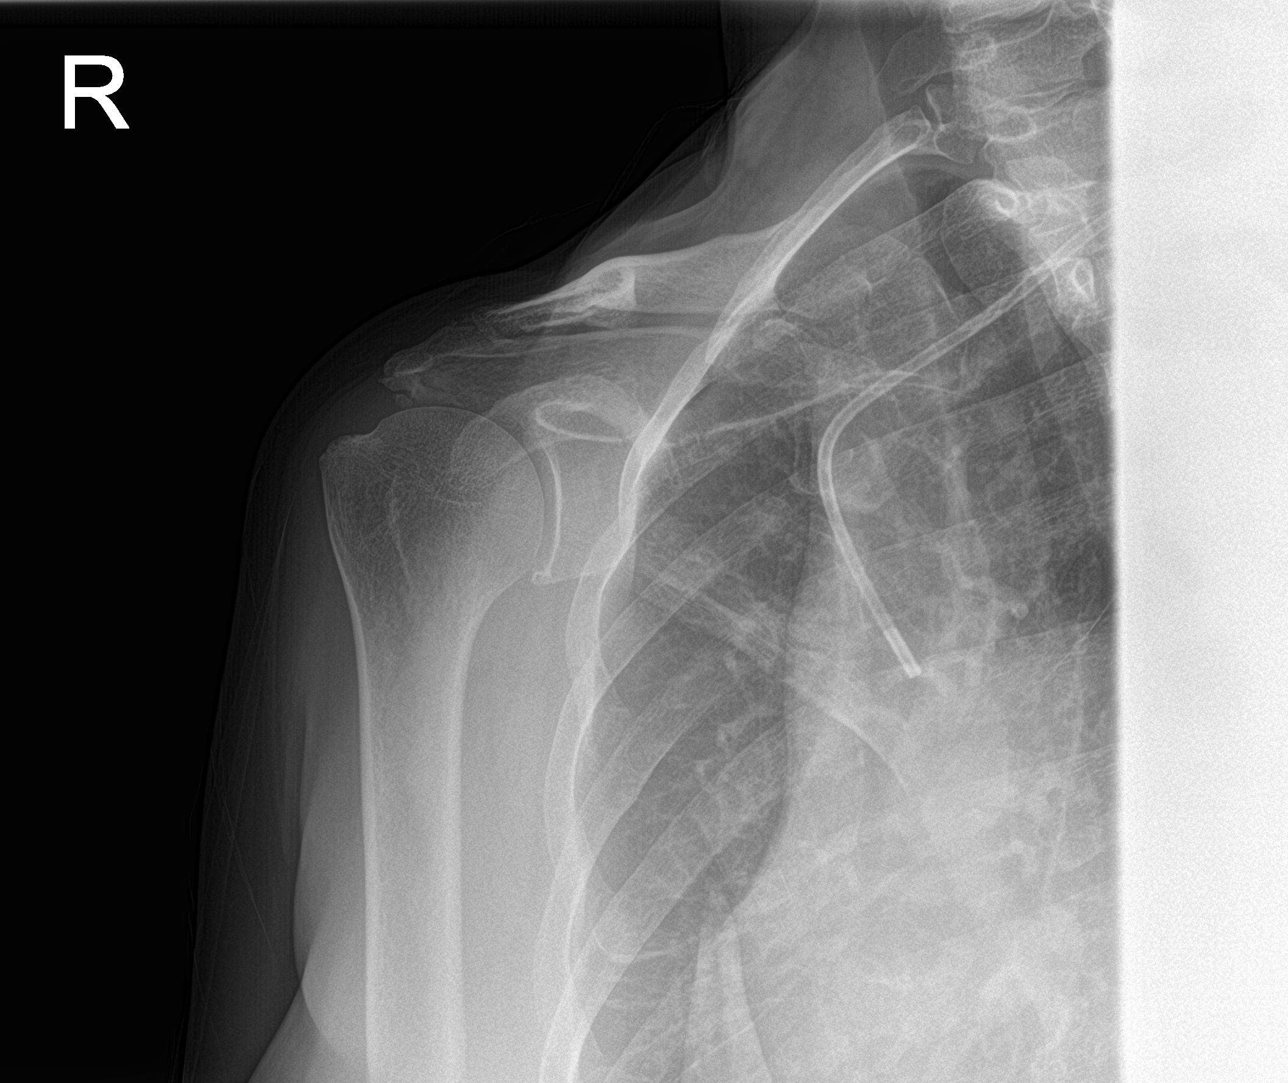

[shoulder y view]
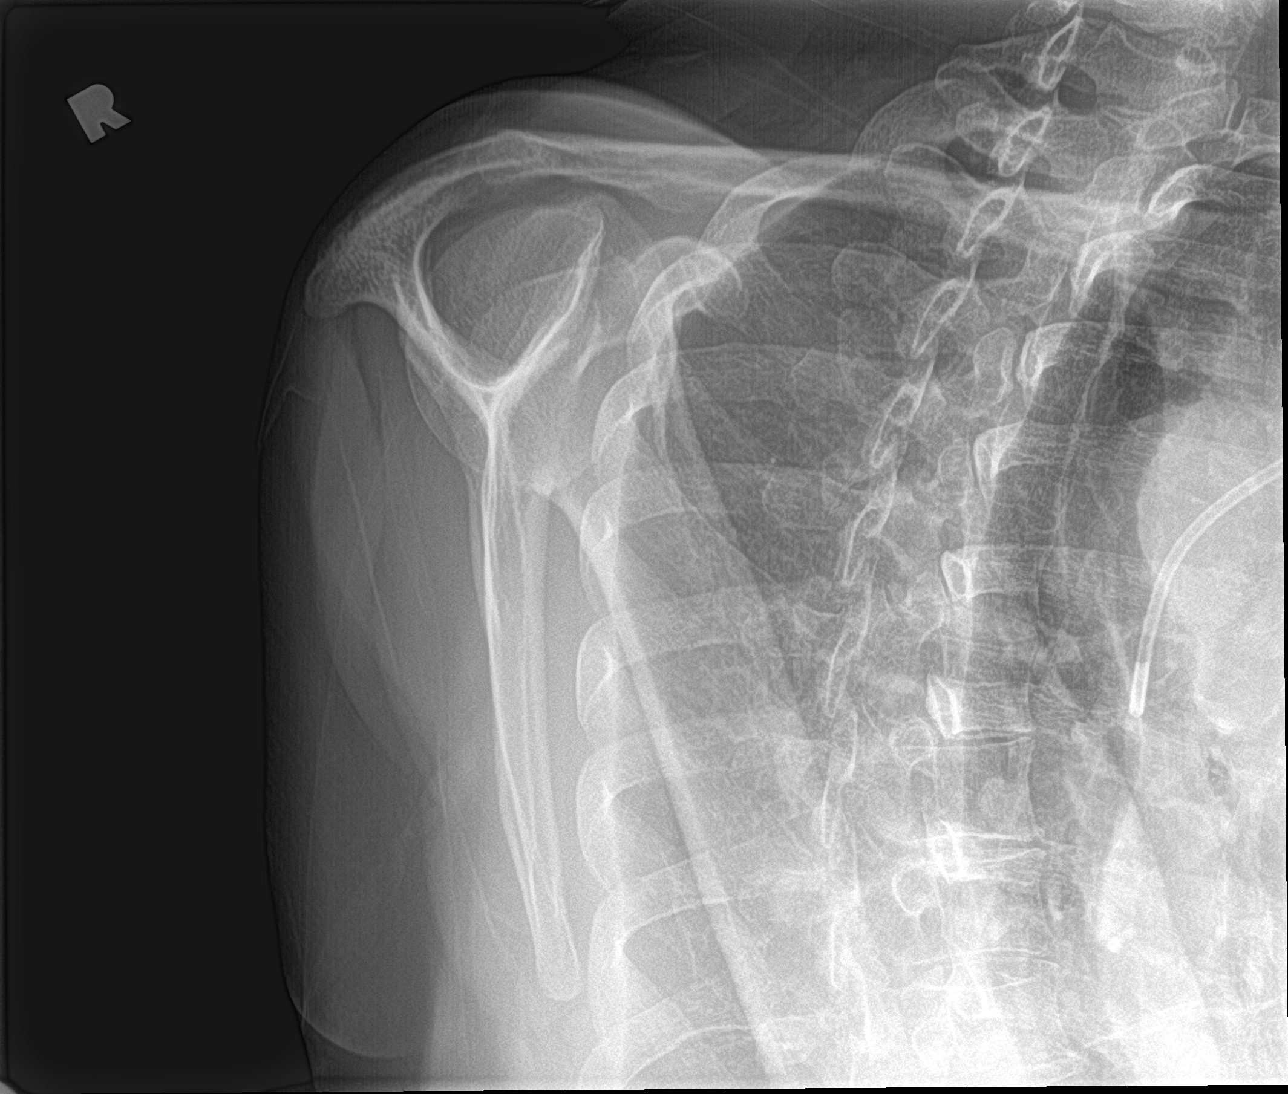

[shoulder axillary]
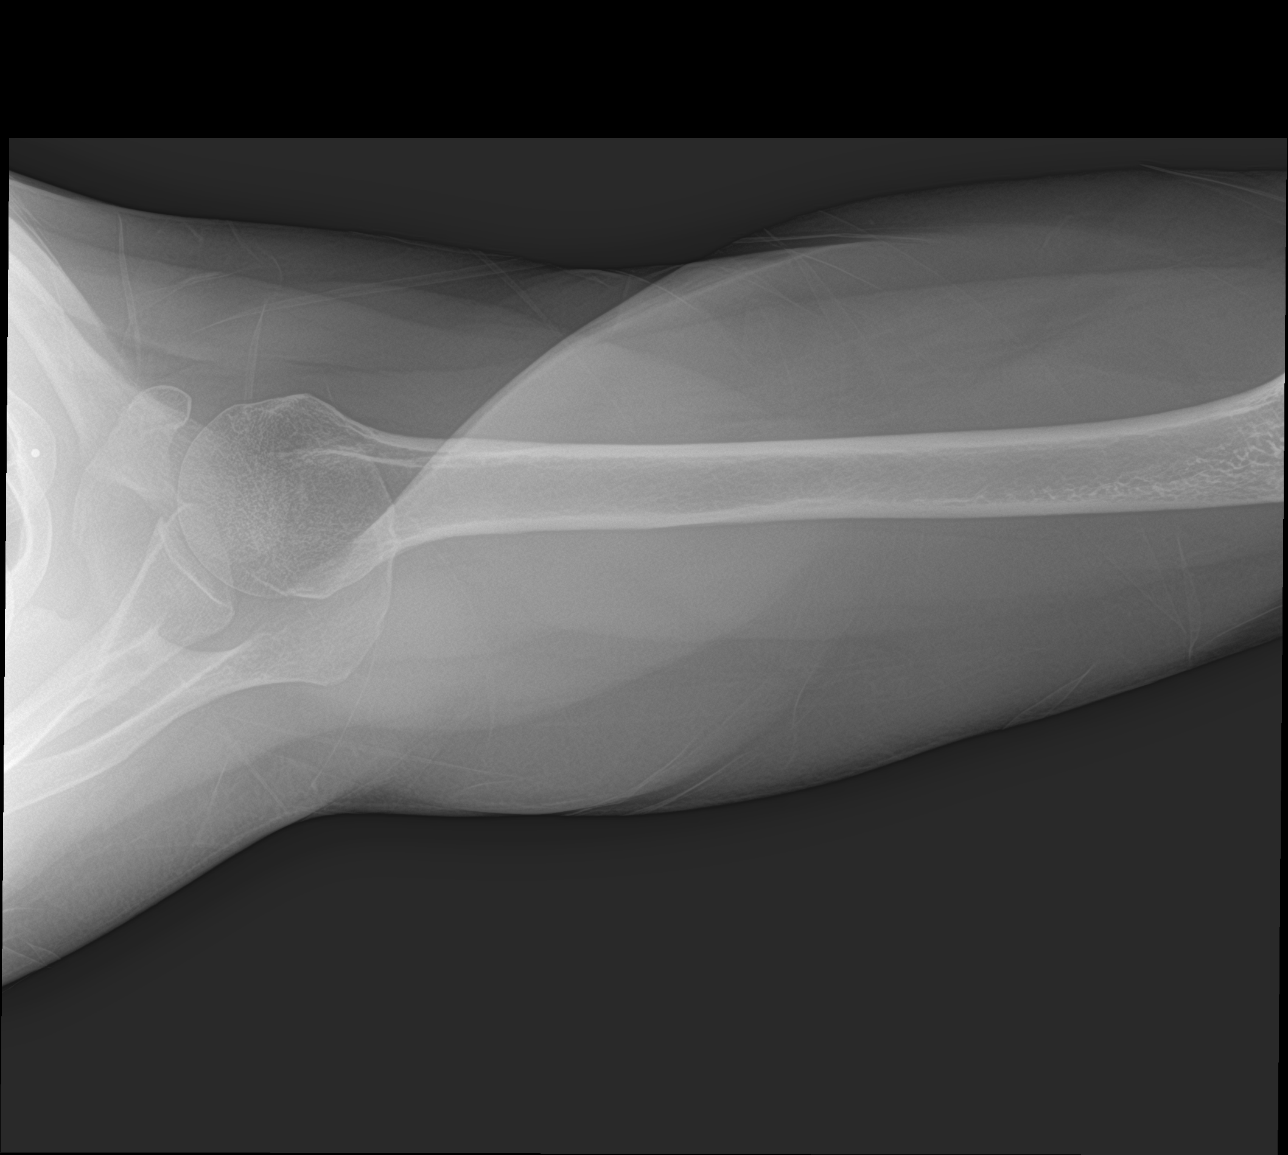

[3 of 3 positions shown; findings below may reference images not displayed]

FINDINGS: There is no evidence of an acute fracture or dislocation. There is
no evidence of arthropathy or other focal bone abnormality. A
left-sided venous catheter is seen. Soft tissues are unremarkable.
IMPRESSION: Negative.

## 2023-05-03 ENCOUNTER — Emergency Department (HOSPITAL_BASED_OUTPATIENT_CLINIC_OR_DEPARTMENT_OTHER): Payer: BC Managed Care – PPO

## 2023-05-03 ENCOUNTER — Encounter (HOSPITAL_BASED_OUTPATIENT_CLINIC_OR_DEPARTMENT_OTHER): Payer: Self-pay

## 2023-05-03 ENCOUNTER — Emergency Department (HOSPITAL_BASED_OUTPATIENT_CLINIC_OR_DEPARTMENT_OTHER)
Admission: EM | Admit: 2023-05-03 | Discharge: 2023-05-04 | Disposition: A | Discharge status: Home / Self Care | Payer: BC Managed Care – PPO | Attending: Emergency Medicine | Admitting: Emergency Medicine

## 2023-05-03 ENCOUNTER — Other Ambulatory Visit: Payer: Self-pay

## 2023-05-03 DIAGNOSIS — W108XXA Fall (on) (from) other stairs and steps, initial encounter: Secondary | ICD-10-CM | POA: Insufficient documentation

## 2023-05-03 DIAGNOSIS — S62102A Fracture of unspecified carpal bone, left wrist, initial encounter for closed fracture: Secondary | ICD-10-CM

## 2023-05-03 DIAGNOSIS — S52612A Displaced fracture of left ulna styloid process, initial encounter for closed fracture: Secondary | ICD-10-CM | POA: Insufficient documentation

## 2023-05-03 DIAGNOSIS — M542 Cervicalgia: Secondary | ICD-10-CM | POA: Insufficient documentation

## 2023-05-03 DIAGNOSIS — S52572A Other intraarticular fracture of lower end of left radius, initial encounter for closed fracture: Secondary | ICD-10-CM | POA: Diagnosis not present

## 2023-05-03 DIAGNOSIS — S6992XA Unspecified injury of left wrist, hand and finger(s), initial encounter: Secondary | ICD-10-CM | POA: Diagnosis present

## 2023-05-03 MED ORDER — HYDROCODONE-ACETAMINOPHEN 5-325 MG PO TABS
1.0000 | ORAL_TABLET | Freq: Once | ORAL | Status: AC
  Administered 2023-05-03: 1 via ORAL
  Filled 2023-05-03: qty 1

## 2023-05-03 NOTE — ED Triage Notes (Signed)
Pt arrives with having a fall today. Pt was walking down her steps and fell down about 10 steps. Pt denies LOC. Pt c/o left wrist pain and neck pain. Pt denies blood thinners. Pt denies hitting her head.

## 2023-05-03 NOTE — ED Notes (Signed)
Patient offered hospital gown for assessment. Patient refused at this time and would like to see EDP first. Informed that if EDP requests or requires so, staff can assist patient changing into hospital gown for assessment. Patient agreeable.

## 2023-05-04 MED ORDER — HYDROCODONE-ACETAMINOPHEN 5-325 MG PO TABS: 1.0000 | ORAL_TABLET | Freq: Four times a day (QID) | ORAL | 0 refills | Status: AC | PRN
